# Patient Record
Sex: Female | Born: 1965 | Hispanic: Yes | Marital: Married | Smoking: Never smoker
Health system: Southern US, Community
[De-identification: ages and names within clinical notes are randomized; demographics above are authoritative.]

---

## 2018-01-31 ENCOUNTER — Ambulatory Visit: Payer: Self-pay | Attending: Oncology

## 2018-08-08 ENCOUNTER — Encounter (INDEPENDENT_AMBULATORY_CARE_PROVIDER_SITE_OTHER): Payer: Self-pay

## 2018-08-08 ENCOUNTER — Encounter: Payer: Self-pay | Admitting: *Deleted

## 2018-08-08 ENCOUNTER — Ambulatory Visit: Payer: Self-pay | Attending: Oncology | Admitting: *Deleted

## 2018-08-08 ENCOUNTER — Other Ambulatory Visit: Payer: Self-pay

## 2018-08-08 ENCOUNTER — Ambulatory Visit
Admission: RE | Admit: 2018-08-08 | Discharge: 2018-08-08 | Disposition: A | Discharge status: Home / Self Care | Payer: Self-pay | Source: Ambulatory Visit | Attending: Oncology | Admitting: Oncology

## 2018-08-08 ENCOUNTER — Other Ambulatory Visit: Payer: Self-pay | Admitting: *Deleted

## 2018-08-08 VITALS — BP 138/31 | HR 73 | Temp 98.0°F | Ht 62.0 in | Wt 179.0 lb

## 2018-08-08 DIAGNOSIS — N6489 Other specified disorders of breast: Secondary | ICD-10-CM

## 2018-08-08 DIAGNOSIS — Z0000 Encounter for general adult medical examination without abnormal findings: Secondary | ICD-10-CM | POA: Insufficient documentation
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
## 2018-08-08 NOTE — Patient Instructions (Signed)
Gave patient hand-out, Women Staying Healthy, Active and Well from [facility], with education on breast health, pap smears, heart and colon health. 

## 2018-09-13 ENCOUNTER — Encounter: Payer: Self-pay | Admitting: *Deleted

## 2018-09-13 NOTE — Progress Notes (Signed)
Patient has not been scheduled for her additional views.  Certified letter mailed by the [facility].  I have asked Joellyn Quails to try and contact the patient before I close her case as refusal to follow-up.

## 2018-09-20 ENCOUNTER — Ambulatory Visit
Admission: RE | Admit: 2018-09-20 | Discharge: 2018-09-20 | Disposition: A | Discharge status: Home / Self Care | Payer: Self-pay | Source: Ambulatory Visit | Attending: Oncology | Admitting: Oncology

## 2018-09-20 ENCOUNTER — Other Ambulatory Visit: Payer: Self-pay

## 2018-09-20 DIAGNOSIS — N6489 Other specified disorders of breast: Secondary | ICD-10-CM

## 2018-09-20 DIAGNOSIS — N63 Unspecified lump in unspecified breast: Secondary | ICD-10-CM

## 2018-09-20 NOTE — Progress Notes (Signed)
Radiologist discussed Birads 3 results with patient.  Scheduled 6 month follow-up mammogram and ultrasound on 03/25/2019.  Letter with appointment info mailed to patient.

## 2018-09-24 ENCOUNTER — Encounter: Payer: Self-pay | Admitting: *Deleted

## 2018-09-24 NOTE — Progress Notes (Unsigned)
Letter mailed by Coralee Rud, RN with appointment date for 6 month follow up mammogram on 03/25/19 @ 11:00.  [facility] to [place].

## 2019-03-25 ENCOUNTER — Other Ambulatory Visit: Payer: Self-pay

## 2020-07-08 ENCOUNTER — Ambulatory Visit: Payer: Self-pay

## 2020-07-08 ENCOUNTER — Other Ambulatory Visit: Payer: Self-pay

## 2020-07-13 NOTE — Progress Notes (Signed)
Patient pre-screened for [facility] eligibility due to COVID 19 precautions. Two patient identifiers used for verification that I was speaking to correct patient.  Patient to Present directly to [facility] 07/15/20 at 1:30 for [facility] screening mammogram. Risk Assessment    Risk Scores      07/15/2020 08/08/2018   Last edited by: Scarlett Presto, RN Scarlett Presto, RN   5-year risk: 0.5 % 0.5 %   Lifetime risk: 3.9 % 4 %

## 2020-07-15 ENCOUNTER — Ambulatory Visit
Admission: RE | Admit: 2020-07-15 | Discharge: 2020-07-15 | Disposition: A | Discharge status: Home / Self Care | Payer: Self-pay | Source: Ambulatory Visit | Attending: Oncology | Admitting: Oncology

## 2020-07-15 ENCOUNTER — Ambulatory Visit: Payer: Self-pay | Attending: Oncology

## 2020-07-15 ENCOUNTER — Other Ambulatory Visit: Payer: Self-pay

## 2020-07-15 DIAGNOSIS — N63 Unspecified lump in unspecified breast: Secondary | ICD-10-CM

## 2020-07-16 NOTE — Progress Notes (Signed)
Letter mailed from [facility] to notify of normal mammogram results.  Patient to return in one year for annual screening.  Copy to [facility]. 

## 2020-08-19 ENCOUNTER — Encounter: Payer: Self-pay | Admitting: *Deleted

## 2020-08-19 ENCOUNTER — Ambulatory Visit: Payer: Self-pay | Admitting: Gastroenterology

## 2021-09-28 ENCOUNTER — Ambulatory Visit
Admission: EM | Admit: 2021-09-28 | Discharge: 2021-09-28 | Disposition: A | Discharge status: Home / Self Care | Payer: 59 | Attending: Internal Medicine | Admitting: Internal Medicine

## 2021-09-28 ENCOUNTER — Other Ambulatory Visit: Payer: Self-pay

## 2021-09-28 ENCOUNTER — Ambulatory Visit (INDEPENDENT_AMBULATORY_CARE_PROVIDER_SITE_OTHER): Payer: 59

## 2021-09-28 DIAGNOSIS — K529 Noninfective gastroenteritis and colitis, unspecified: Secondary | ICD-10-CM | POA: Diagnosis not present

## 2021-09-28 DIAGNOSIS — R1010 Upper abdominal pain, unspecified: Secondary | ICD-10-CM | POA: Diagnosis not present

## 2021-09-28 LAB — CBC WITH DIFFERENTIAL/PLATELET
Abs Immature Granulocytes: 0.03 10*3/uL (ref 0.00–0.07)
Basophils Absolute: 0 10*3/uL (ref 0.0–0.1)
Basophils Relative: 0 %
Eosinophils Absolute: 0 10*3/uL (ref 0.0–0.5)
Eosinophils Relative: 0 %
HCT: 42.1 % (ref 36.0–46.0)
Hemoglobin: 13.9 g/dL (ref 12.0–15.0)
Immature Granulocytes: 0 %
Lymphocytes Relative: 4 %
Lymphs Abs: 0.5 10*3/uL — ABNORMAL LOW (ref 0.7–4.0)
MCH: 27.9 pg (ref 26.0–34.0)
MCHC: 33 g/dL (ref 30.0–36.0)
MCV: 84.4 fL (ref 80.0–100.0)
Monocytes Absolute: 0.7 10*3/uL (ref 0.1–1.0)
Monocytes Relative: 5 %
Neutro Abs: 10.9 10*3/uL — ABNORMAL HIGH (ref 1.7–7.7)
Neutrophils Relative %: 91 %
Platelets: 217 10*3/uL (ref 150–400)
RBC: 4.99 MIL/uL (ref 3.87–5.11)
RDW: 14.2 % (ref 11.5–15.5)
WBC: 12.1 10*3/uL — ABNORMAL HIGH (ref 4.0–10.5)
nRBC: 0 % (ref 0.0–0.2)

## 2021-09-28 LAB — COMPREHENSIVE METABOLIC PANEL
ALT: 21 U/L (ref 0–44)
AST: 26 U/L (ref 15–41)
Albumin: 4.6 g/dL (ref 3.5–5.0)
Alkaline Phosphatase: 100 U/L (ref 38–126)
Anion gap: 9 (ref 5–15)
BUN: 10 mg/dL (ref 6–20)
CO2: 26 mmol/L (ref 22–32)
Calcium: 9.1 mg/dL (ref 8.9–10.3)
Chloride: 100 mmol/L (ref 98–111)
Creatinine, Ser: 0.54 mg/dL (ref 0.44–1.00)
GFR, Estimated: >60 mL/min (ref >60)
Glucose, Bld: 117 mg/dL — ABNORMAL HIGH (ref 70–99)
Potassium: 3.8 mmol/L (ref 3.5–5.1)
Sodium: 135 mmol/L (ref 135–145)
Total Bilirubin: 0.9 mg/dL (ref 0.3–1.2)
Total Protein: 8.3 g/dL — ABNORMAL HIGH (ref 6.5–8.1)

## 2021-09-28 LAB — URINALYSIS, ROUTINE W REFLEX MICROSCOPIC
Bilirubin Urine: NEGATIVE
Glucose, UA: NEGATIVE mg/dL
Hgb urine dipstick: NEGATIVE
Ketones, ur: 15 mg/dL — AB
Leukocytes,Ua: NEGATIVE
Nitrite: NEGATIVE
Protein, ur: 30 mg/dL — AB
Specific Gravity, Urine: 1.02 (ref 1.005–1.030)
pH: 7.5 (ref 5.0–8.0)

## 2021-09-28 LAB — AMYLASE: Amylase: 55 U/L (ref 28–100)

## 2021-09-28 LAB — URINALYSIS, MICROSCOPIC (REFLEX)

## 2021-09-28 LAB — LIPASE, BLOOD: Lipase: 27 U/L (ref 11–51)

## 2021-09-28 MED ORDER — ONDANSETRON 8 MG PO TBDP
8.0000 mg | ORAL_TABLET | Freq: Once | ORAL | Status: AC
  Administered 2021-09-28: 8 mg via ORAL

## 2021-09-28 MED ORDER — ONDANSETRON HCL 4 MG PO TABS: 8.0000 mg | ORAL_TABLET | ORAL | 0 refills | Status: DC | PRN

## 2021-09-28 MED ORDER — KETOROLAC TROMETHAMINE 60 MG/2ML IM SOLN
60.0000 mg | Freq: Once | INTRAMUSCULAR | Status: AC
  Administered 2021-09-28: 60 mg via INTRAMUSCULAR

## 2021-09-28 MED ORDER — ONDANSETRON HCL 4 MG/2ML IJ SOLN: 8.0000 mg | Freq: Once | INTRAMUSCULAR | Status: DC

## 2021-09-28 MED ORDER — FAMOTIDINE 20 MG PO TABS: 20.0000 mg | ORAL_TABLET | Freq: Two times a day (BID) | ORAL | 0 refills | Status: AC

## 2021-09-28 NOTE — Discharge Instructions (Signed)
Symptoms and exam today are consistent with a stomach bug, typically caused by a virus.  Labs including blood counts, chemistries, liver and kidney tests, and pancreatic enzymes are all consistent with a viral infection.  No danger signs on labs, xray, or exam.  Anticipate symptoms will run their course over the next several days.  Prescriptions for ondansetron (for nausea) and famotidine (for nausea and upper abdominal pain) were sent to the pharmacy.  Push fluids and rest.  Diet as tolerated.  Recheck or go to ED for severe/sustained abdominal pain, persistent vomiting, persistent (>3 days) fever >100.5, or if not improving as expected.   ?

## 2021-09-28 NOTE — ED Provider Notes (Signed)
?[facility] ? ? ? ?CSN: [id number] ?Arrival date & time: 09/28/21  1234 ? ? ?  ? ?History   ?Chief Complaint ?Chief Complaint  ?Patient presents with  ? Generalized Body Aches  ? Abdominal Pain  ? ? ?HPI ?Alexa Moran is a 56 y.o. female.  She presents today with upper abdominal pain, rates this 10 out of 10, and says it started about 3 hours ago.  She can feel it through to her back.   ? ?No vomiting, although has been nauseous and has had small-volume retching of "phlegm".   ? ?Has had cough for couple of weeks, not getting worse, maybe a little bit of sore throat.  Has a bad headache.   ? ?No diarrhea, had a BM just prior to coming to the urgent care today.  Sometimes feels constipated, has to strain, has recently felt like she had to go many times, but can only go a little bit.   ? ?Fever to 100.9.  Denies any urinary symptoms, no vaginal discharge or bleeding.   ? ?Had her tubes tied after her youngest child, unclear if she also had a C-section or not, no other abdominal surgeries. ? ?The only chronic medical condition she endorses is pain in the left knee for which she sometimes takes an arthritis pill, most recently about 4 days ago. ? ? ?Abdominal Pain ? ?History reviewed. No pertinent past medical history. ? ?There are no problems to display for this patient. ? ? ?History reviewed. No pertinent surgical history. ? ? ?Home Medications   ? ?Pill for arthritis pain in left knee, unknown name ? ? ?Family History ?Family History  ?Problem Relation Age of Onset  ? Breast cancer Neg Hx   ? ? ?Social History ?Social History  ? ?Tobacco Use  ? Smoking status: Never  ? Smokeless tobacco: Never  ?Vaping Use  ? Vaping Use: Never used  ?Substance Use Topics  ? Alcohol use: Never  ? Drug use: Never  ? ? ? ?Allergies   ?Patient has no known allergies. ?No medication allergies per patient ? ?Review of Systems ?Review of Systems  ?Gastrointestinal:  Positive for abdominal pain.  See HPI ? ? ?Physical  Exam ?Triage Vital Signs ?ED Triage Vitals  ?Enc Vitals Group  ?   BP 09/28/21 1436 128/74  ?   Pulse Rate 09/28/21 1436 90  ?   Resp 09/28/21 1436 18  ?   Temp 09/28/21 1436 (!) 100.9 ?F (38.3 ?C)  ?   Temp Source 09/28/21 1436 Oral  ?   SpO2 09/28/21 1436 96 %  ?   Weight 09/28/21 1438 178 lb (80.7 kg)  ?   Height 09/28/21 1438 5\' 3"  (1.6 m)  ?   Pain Score 09/28/21 1436 10  ?   Pain Loc --   ? ? ?Updated Vital Signs ?BP 128/74 (BP Location: Left Arm)   Pulse 90   Temp (!) 100.9 ?F (38.3 ?C) (Oral)   Resp 18   Ht 5\' 3"  (1.6 m)   Wt 80.7 kg   SpO2 96%   BMI 31.53 kg/m?  ? ?Physical Exam ?Constitutional:   ?   General: She is in acute distress.  ?   Appearance: She is ill-appearing. She is not toxic-appearing.  ?   Comments: Holding abdomen, looks uncomfortable ?Good hygiene ?Translator service used  ?HENT:  ?   Head: Atraumatic.  ?   Mouth/Throat:  ?   Mouth: Mucous membranes are moist.  ?  Eyes:  ?   Conjunctiva/sclera:  ?   Right eye: Right conjunctiva is not injected. No exudate. ?   Left eye: Left conjunctiva is not injected. No exudate. ?   Comments: Conjugate gaze observed  ?Cardiovascular:  ?   Rate and Rhythm: Normal rate and regular rhythm.  ?Pulmonary:  ?   Effort: Pulmonary effort is normal. No respiratory distress.  ?   Breath sounds: No wheezing or rhonchi.  ?Abdominal:  ?   Palpations: Abdomen is soft.  ?   Tenderness: There is no right CVA tenderness or left CVA tenderness.  ?   Comments: Moderate tenderness to deep palpation throughout, appears most intense in the epigastrium.  Equivocal voluntary type guarding, no frank rebound  ?Musculoskeletal:  ?   Comments: Walked into the urgent care independently ?Most comfortable reclining on the exam table  ?Skin: ?   General: Skin is warm and dry.  ?   Comments: No cyanosis  ?Neurological:  ?   Mental Status: She is alert.  ?   Comments: Face is symmetric  ? ? ? ?UC Treatments / Results  ?Labs ?(all labs ordered are listed, but only abnormal results  are displayed) ?Labs Reviewed  ?COMPREHENSIVE METABOLIC PANEL - Abnormal; Notable for the following components:  ?    Result Value  ? Glucose, Bld 117 (*)   ? Total Protein 8.3 (*)   ? All other components within normal limits  ?CBC WITH DIFFERENTIAL/PLATELET - Abnormal; Notable for the following components:  ? WBC 12.1 (*)   ? Neutro Abs 10.9 (*)   ? Lymphs Abs 0.5 (*)   ? All other components within normal limits  ?URINALYSIS, ROUTINE W REFLEX MICROSCOPIC - Abnormal; Notable for the following components:  ? APPearance HAZY (*)   ? Ketones, ur 15 (*)   ? Protein, ur 30 (*)   ? All other components within normal limits  ?URINALYSIS, MICROSCOPIC (REFLEX) - Abnormal; Notable for the following components:  ? Bacteria, UA FEW (*)   ? All other components within normal limits  ?AMYLASE  ?LIPASE, BLOOD  ?Hgb 13.9, plts 217, creat 0.54, ast 26, alt 21, lytes unremarkable, CO2 26. ?Amylase & lipase wnl ?UA 0-5 RBC, 0-5 WBC, few bacteria, 6-10 squames.   ? ?EKG ?NA ? ?Radiology ?DG Abd Acute W/Chest ? ?Result Date: 09/28/2021 ?CLINICAL DATA:  Acute upper abdominal pain. EXAM: DG ABDOMEN ACUTE WITH 1 VIEW CHEST COMPARISON:  None. FINDINGS: There is no evidence of dilated bowel loops or free intraperitoneal air. No radiopaque calculi or other significant radiographic abnormality is seen. Heart size and mediastinal contours are within normal limits. Both lungs are clear. IMPRESSION: Negative abdominal radiographs.  No acute cardiopulmonary disease. Electronically Signed   By: Lupita RaiderJames  Green Jr M.D.   On: 09/28/2021 16:17   ? ?Procedures ?Procedures (including critical care time) ?NA ? ?Medications Ordered in UC ?Medications  ?ketorolac (TORADOL) injection 60 mg (60 mg Intramuscular Given 09/28/21 1550)  ?ondansetron (ZOFRAN-ODT) disintegrating tablet 8 mg (8 mg Oral Given 09/28/21 1549)  ? ? ?Initial Impression / Assessment and Plan / UC Course  ? ?Significant mprovement in overall well being 1 hour after toradol/zofran. ?Labs  reassuring, abd xrays unremarkable.   ? ?Final Clinical Impressions(s) / UC Diagnoses  ? ?Final diagnoses:  ?Acute gastroenteritis  ? ? ? ?Discharge Instructions   ? ?  ?Symptoms and exam today are consistent with a stomach bug, typically caused by a virus.  Labs including blood counts, chemistries, liver and  kidney tests, and pancreatic enzymes are all consistent with a viral infection.  No danger signs on labs, xray, or exam.  Anticipate symptoms will run their course over the next several days.  Prescriptions for ondansetron (for nausea) and famotidine (for nausea and upper abdominal pain) were sent to the pharmacy.  Push fluids and rest.  Diet as tolerated.  Recheck or go to ED for severe/sustained abdominal pain, persistent vomiting, persistent (>3 days) fever >100.5, or if not improving as expected.   ? ? ? ? ?ED Prescriptions   ? ? Medication Sig Dispense Auth. Provider  ? ondansetron (ZOFRAN) 4 MG tablet Take 2 tablets (8 mg total) by mouth every 4 (four) hours as needed for nausea or vomiting. 20 tablet Isa Rankin, MD  ? famotidine (PEPCID) 20 MG tablet Take 1 tablet (20 mg total) by mouth 2 (two) times daily. 30 tablet Isa Rankin, MD  ? ?  ? ?PDMP not reviewed this encounter. ?  ?Isa Rankin, MD ?09/29/21 1208 ? ?

## 2021-09-28 NOTE — ED Triage Notes (Signed)
Used interpreter Doran Heater [phone] ? ?Pt c/o pain at the top of the stomach and along her back x1day.  ? ?Pt states that her last bowel movement was before coming to the urgent care.  ?

## 2022-06-19 IMAGING — MG DIGITAL DIAGNOSTIC BILAT W/ TOMO W/ CAD
6 of 12 series · 6 of 36 positions shown · non-contrast
Comparison: Previous exam(s).

CLINICAL DATA: Delayed follow-up for probably benign cyst in the
LEFT breast at the 6 o'clock axis. Patient also describes a palpable
lump within the upper-outer quadrant of the LEFT breast.

EXAM:
DIGITAL DIAGNOSTIC BILATERAL MAMMOGRAM WITH CAD AND TOMOSYNTHESIS
ULTRASOUND BREAST LEFT
TECHNIQUE: Bilateral digital diagnostic mammography and breast tomosynthesis
was performed. Digital images of the breasts were evaluated with
computer-aided detection. Targeted ultrasound examination of the
breast was performed.

[L CC synth-2D]
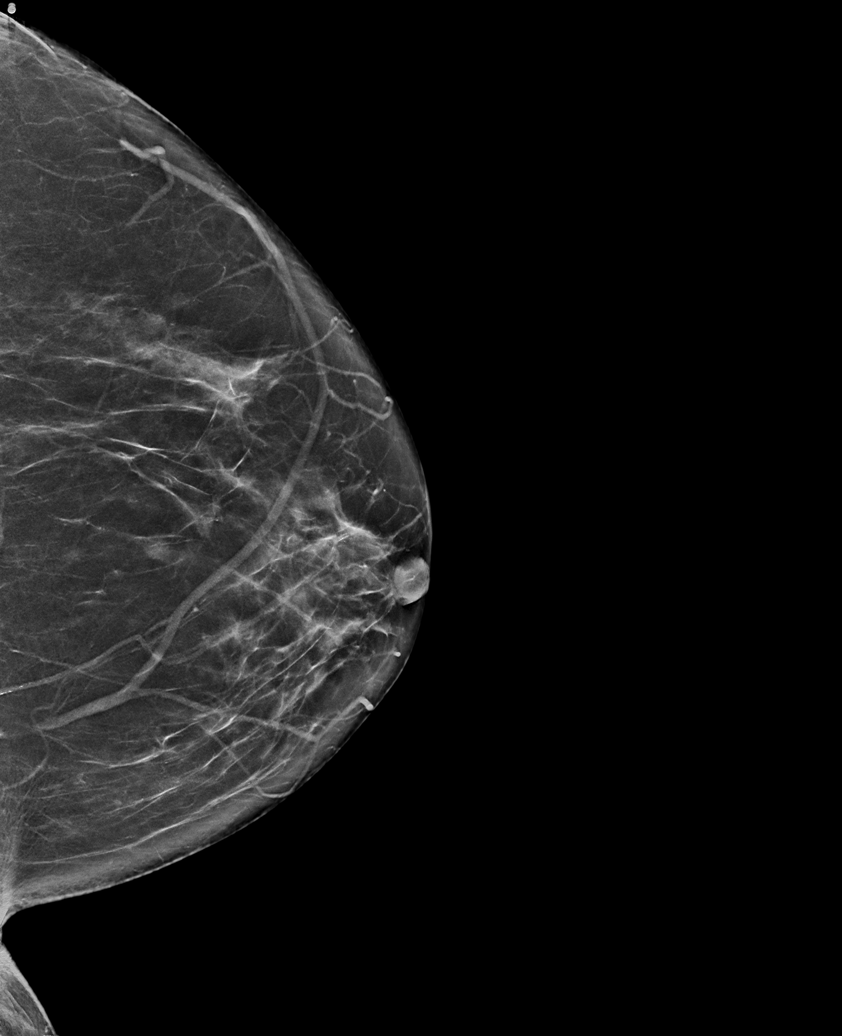

[R CC synth-2D]
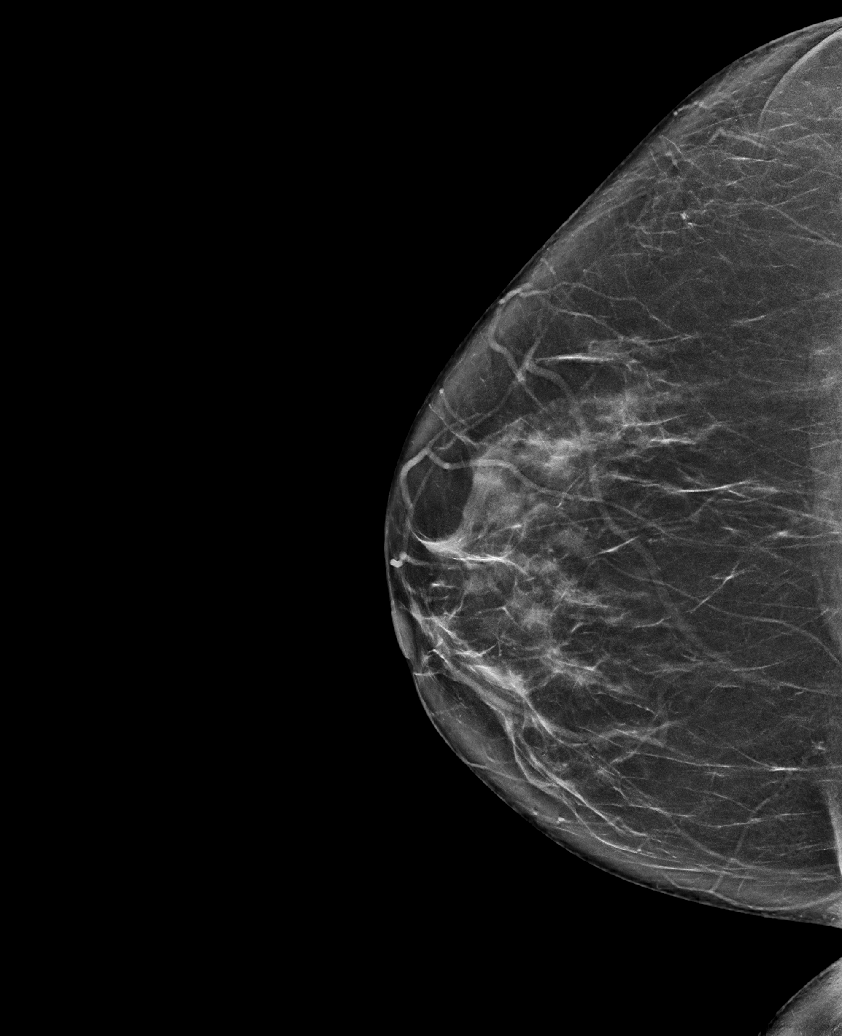

[R MLO synth-2D]
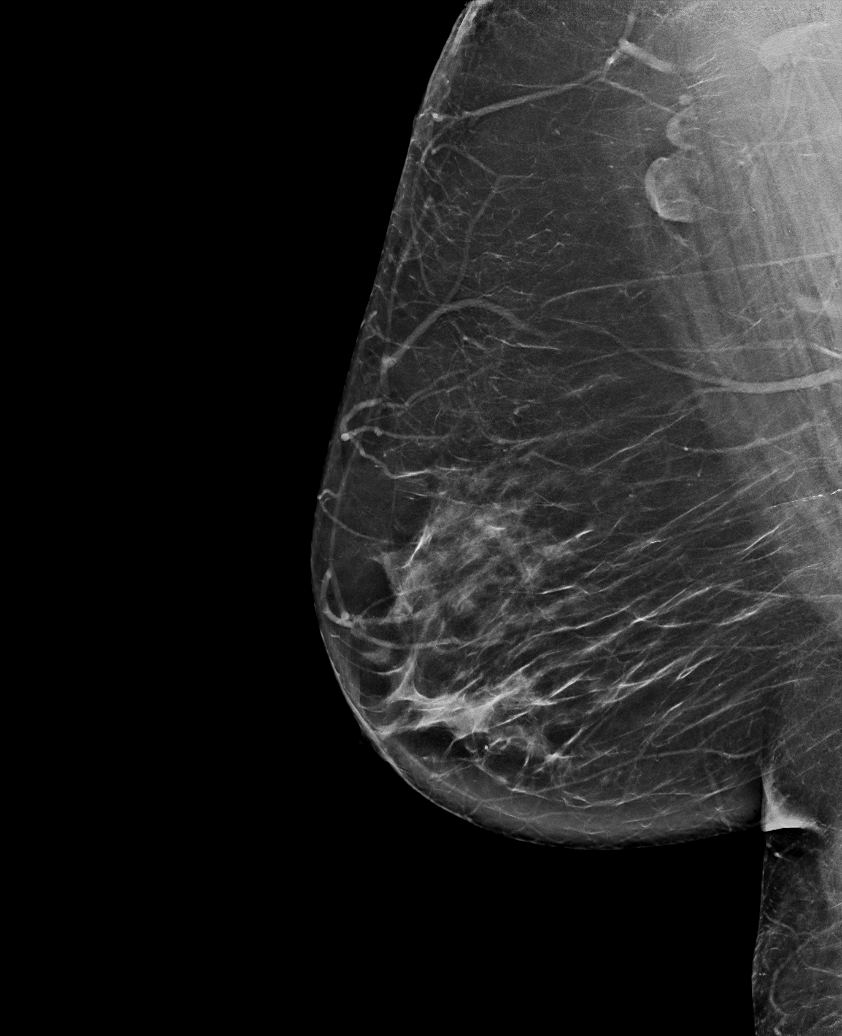

[L TAN synth-2D]
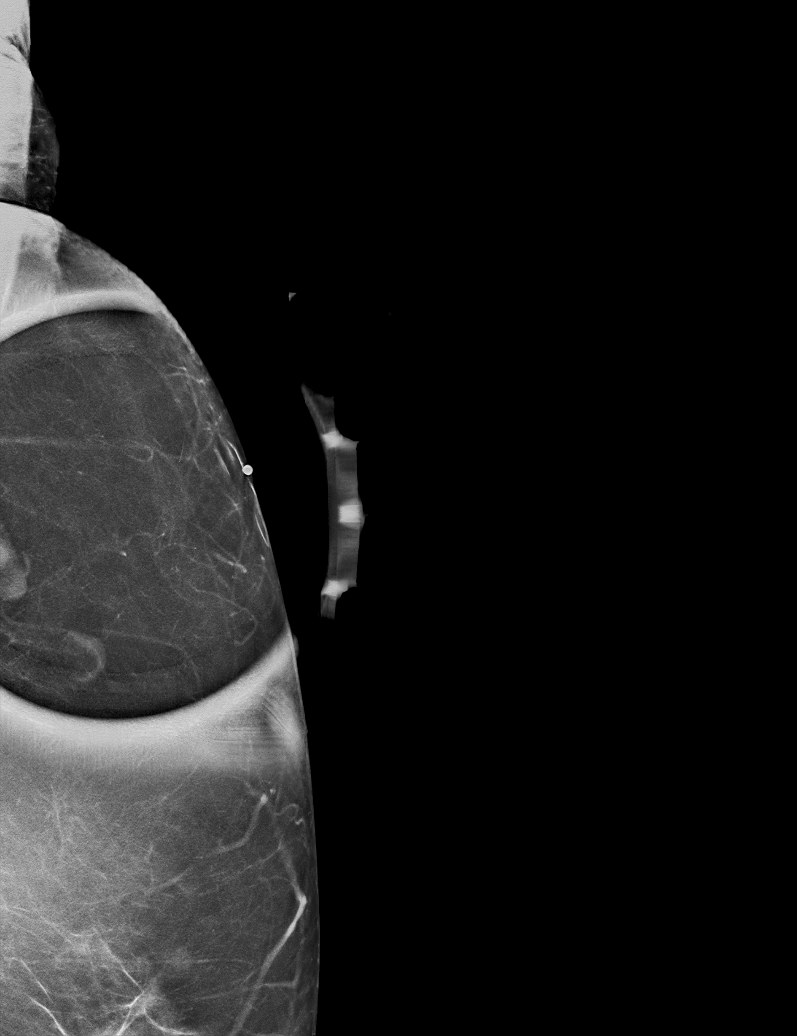

[L MLO synth-2D]
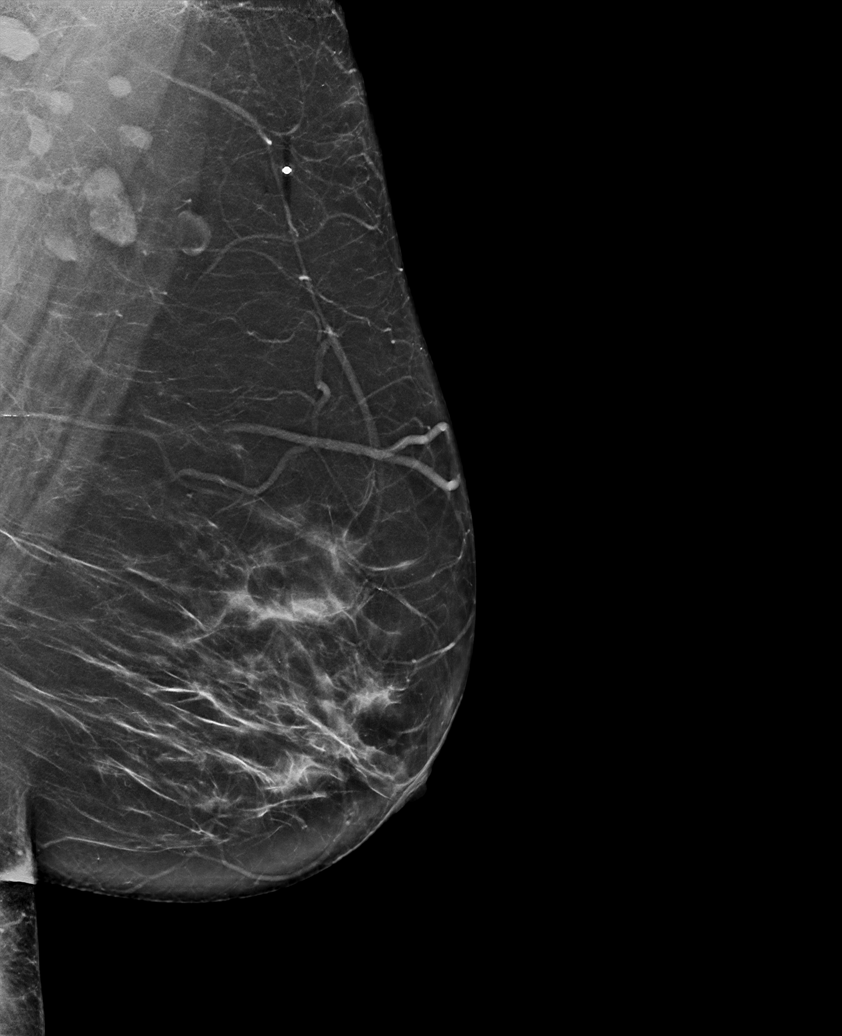

[L XCCL synth-2D]
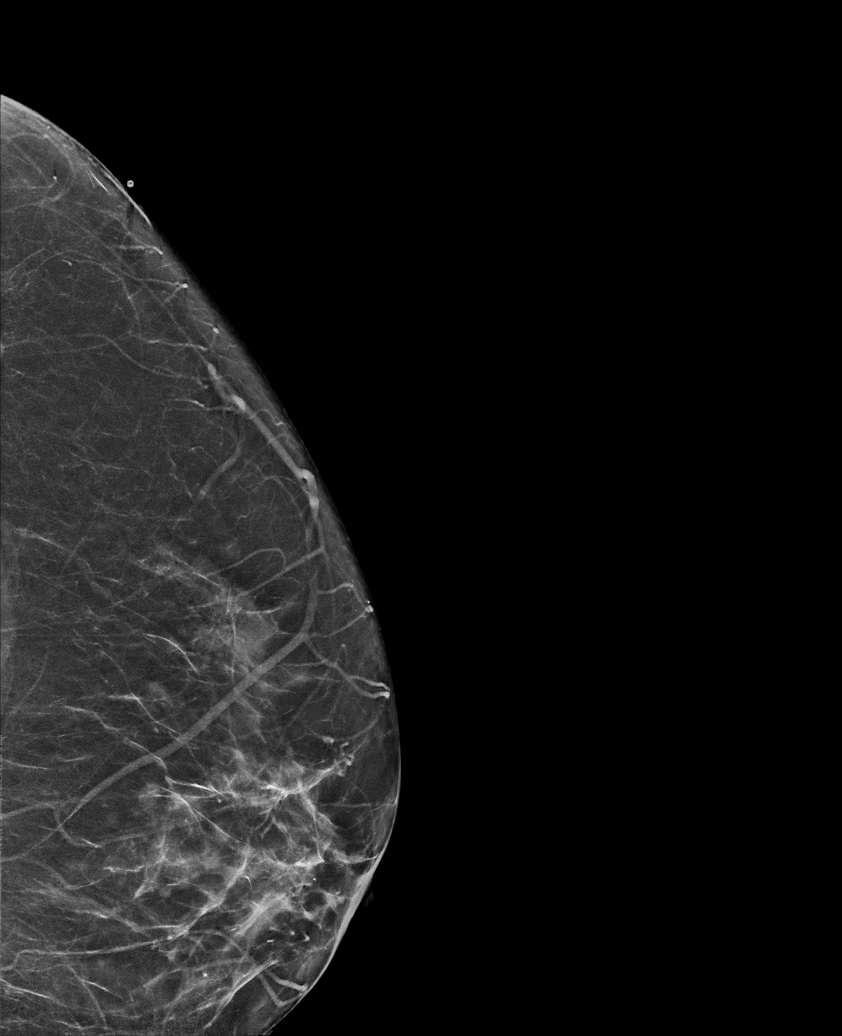

[6 of 36 positions shown; findings below may reference images not displayed]

ACR Breast Density Category c: The breast tissue is heterogeneously
dense, which may obscure small masses.
FINDINGS: There are no new dominant masses, suspicious calcifications or
secondary signs of malignancy within either breast. Previously
identified low-density mass within the lower central LEFT breast is
stable in size suggesting benignity.

Targeted ultrasound is performed, again showing an oval nearly
anechoic mass in the LEFT breast at the 6 o'clock axis, 2 cm from
the nipple, measuring 6 mm, not significantly changed for nearly 2
years confirming benignity, compatible with a benign cyst.

Additional targeted ultrasound is performed of the outer LEFT breast
with particular attention to the 3 o'clock axis as directed by the
patient, showing an additional benign cyst at the 3 o'clock axis, 8
cm from the nipple, measuring 6 mm, corresponding to the palpable
area of concern. No suspicious solid or cystic mass is identified by
ultrasound in this area.
IMPRESSION: 1. No evidence of malignancy within either breast.
2. Two benign cysts within the LEFT breast, 1 of which corresponds
to the palpable area of concern. No follow-up is needed for these
benign findings.

Patient may return to routine annual bilateral screening mammogram
schedule.

RECOMMENDATION:
Screening mammogram in one year.(Code:[id number]-C-FZ9)

I have discussed the findings and recommendations with the patient.
If applicable, a reminder letter will be sent to the patient
regarding the next appointment.

BI-RADS CATEGORY  2: Benign.

## 2024-02-08 ENCOUNTER — Ambulatory Visit (INDEPENDENT_AMBULATORY_CARE_PROVIDER_SITE_OTHER)

## 2024-02-08 ENCOUNTER — Ambulatory Visit
Admission: EM | Admit: 2024-02-08 | Discharge: 2024-02-08 | Disposition: A | Discharge status: Home / Self Care | Attending: Family Medicine | Admitting: Family Medicine

## 2024-02-08 DIAGNOSIS — R300 Dysuria: Secondary | ICD-10-CM | POA: Insufficient documentation

## 2024-02-08 DIAGNOSIS — R509 Fever, unspecified: Secondary | ICD-10-CM | POA: Insufficient documentation

## 2024-02-08 DIAGNOSIS — J989 Respiratory disorder, unspecified: Secondary | ICD-10-CM | POA: Insufficient documentation

## 2024-02-08 DIAGNOSIS — J101 Influenza due to other identified influenza virus with other respiratory manifestations: Secondary | ICD-10-CM | POA: Diagnosis present

## 2024-02-08 LAB — URINALYSIS, W/ REFLEX TO CULTURE (INFECTION SUSPECTED)
Glucose, UA: NEGATIVE mg/dL
Ketones, ur: 80 mg/dL — AB
Leukocytes,Ua: NEGATIVE
Nitrite: NEGATIVE
Protein, ur: 30 mg/dL — AB
RBC / HPF: NONE SEEN RBC/hpf (ref 0–5)
Specific Gravity, Urine: 1.02 (ref 1.005–1.030)
pH: 6.5 (ref 5.0–8.0)

## 2024-02-08 LAB — RESP PANEL BY RT-PCR (FLU A&B, COVID) ARPGX2
Influenza A by PCR: NEGATIVE
Influenza B by PCR: POSITIVE — AB
SARS Coronavirus 2 by RT PCR: NEGATIVE

## 2024-02-08 MED ORDER — ONDANSETRON HCL 4 MG PO TABS: 4.0000 mg | ORAL_TABLET | Freq: Four times a day (QID) | ORAL | 0 refills | Status: AC | PRN

## 2024-02-08 MED ORDER — IBUPROFEN 800 MG PO TABS
800.0000 mg | ORAL_TABLET | Freq: Once | ORAL | Status: AC
  Administered 2024-02-08: 800 mg via ORAL

## 2024-02-08 NOTE — ED Triage Notes (Signed)
 Used interpreter Aloysius [phone]  Pt is with her daughter  Pt c/o pain in the left flank, abdominal pain, headache, body aches, and trouble breathing x3days

## 2024-02-08 NOTE — ED Provider Notes (Signed)
 [facility]    CSN: [id number] Arrival date & time: 02/08/24  1005      History   Chief Complaint Chief Complaint  Patient presents with   Cough   Diarrhea   Shortness of Breath    HPI Alexa Moran is a 58 y.o. female.   HPI  A Spanish interpreter was used for this encounter:  Name: Aloysius ID #[id number]  History obtained from the patient and daughter . Nick presents for headache, lower abdominal pain, chest pain, cough, rhinorrhea, nasal congestion, vomiting, diarrhea, dysuria, fever that started 3 days ago.  No known sick contacts.  Last night, she took fever reducing medication. She didn't take her temperature but was really hot. Chest pain and shortness of breath occur with coughing.    No LMP recorded. Patient is postmenopausal.     History reviewed. No pertinent past medical history.  There are no active problems to display for this patient.   History reviewed. No pertinent surgical history.  OB History   No obstetric history on file.      Home Medications    Prior to Admission medications   Medication Sig Start Date End Date Taking? Authorizing Provider  famotidine  (PEPCID ) 20 MG tablet Take 1 tablet (20 mg total) by mouth 2 (two) times daily. 09/28/21  Yes Jason Leita Blush, MD  ondansetron  (ZOFRAN ) 4 MG tablet Take 2 tablets (8 mg total) by mouth every 4 (four) hours as needed for nausea or vomiting. 09/28/21  Yes Jason Leita Blush, MD    Family History Family History  Problem Relation Age of Onset   Breast cancer Neg Hx     Social History Social History   Tobacco Use   Smoking status: Never   Smokeless tobacco: Never  Vaping Use   Vaping status: Never Used  Substance Use Topics   Alcohol use: Never   Drug use: Never     Allergies   Patient has no known allergies.   Review of Systems Review of Systems: negative unless otherwise stated in HPI.      Physical Exam Triage Vital Signs ED Triage Vitals   Encounter Vitals Group     BP 02/08/24 1035 131/84     Girls Systolic BP Percentile --      Girls Diastolic BP Percentile --      Boys Systolic BP Percentile --      Boys Diastolic BP Percentile --      Pulse Rate 02/08/24 1035 92     Resp --      Temp 02/08/24 1035 (!) 100.9 F (38.3 C)     Temp Source 02/08/24 1035 Oral     SpO2 02/08/24 1035 95 %     Weight 02/08/24 1033 188 lb (85.3 kg)     Height --      Head Circumference --      Peak Flow --      Pain Score 02/08/24 1033 9     Pain Loc --      Pain Education --      Exclude from Growth Chart --    No data found.  Updated Vital Signs BP 131/84 (BP Location: Left Arm)   Pulse 92   Temp (!) 100.9 F (38.3 C) (Oral)   Wt 85.3 kg   SpO2 95%   BMI 33.30 kg/m   Visual Acuity Right Eye Distance:   Left Eye Distance:   Bilateral Distance:    Right Eye Near:  Left Eye Near:    Bilateral Near:     Physical Exam GEN:     alert, non-toxic appearing female in no distress ***   HENT:  mucus membranes moist, oropharyngeal ***without lesions or ***erythema, no*** tonsillar hypertrophy or exudates, *** moderate erythematous edematous turbinates, ***clear nasal discharge, ***bilateral TM normal EYES:   pupils equal and reactive, ***no scleral injection or discharge NECK:  normal ROM, no ***lymphadenopathy, ***no meningismus   RESP:  no increased work of breathing, ***clear to auscultation bilaterally CVS:   regular rate ***and rhythm Skin:   warm and dry, no rash on visible skin***    UC Treatments / Results  Labs (all labs ordered are listed, but only abnormal results are displayed) Labs Reviewed  RESP PANEL BY RT-PCR (FLU A&B, COVID) [id number]    EKG   Radiology No results found.  Procedures Procedures (including critical care time)  Medications Ordered in UC Medications  ibuprofen  (ADVIL ) tablet 800 mg (800 mg Oral Given 02/08/24 1045)    Initial Impression / Assessment and Plan / UC Course  I have  reviewed the triage vital signs and the nursing notes.  Pertinent labs & imaging results that were available during my care of the patient were reviewed by me and considered in my medical decision making (see chart for details).       Pt is a 58 y.o. female who presents for *** days of respiratory symptoms. [place] is ***afebrile here without recent antipyretics. Satting well on room air. Overall pt is ***non-toxic appearing, well hydrated, without respiratory distress. Pulmonary exam ***is unremarkable.  COVID and influenza panel obtained ***and was negative. ***Pt to quarantine until COVID test results or longer if positive.  I will call patient with test results, if positive. History consistent with ***viral respiratory illness. Discussed symptomatic treatment.  Explained lack of efficacy of antibiotics in viral disease.  Typical duration of symptoms discussed.   Return and ED precautions given and voiced understanding. Discussed MDM, treatment plan and plan for follow-up with patient*** who agrees with plan.     Final Clinical Impressions(s) / UC Diagnoses   Final diagnoses:  None   Discharge Instructions   None    ED Prescriptions   None    PDMP not reviewed this encounter.

## 2024-02-08 NOTE — Discharge Instructions (Addendum)
 Su radiografa de trax no mostr evidencia de neumona. No tiene una infeccin del tracto urinario. Su prueba de COVID-19 es negativa, pero tiene influenza B. Sus sntomas mejorarn [employer] prximos 7 a [address]. La tos puede durar unas 3 semanas.  Tome ibuprofeno 600 mg con Tylenol 1000 mg para la fiebre, el dolor de cabeza o Orthoptist.  Para la tos: Tambin puede usar guaifenesina y dextrometorfano para la tos. Puede usar un humidificador para la congestin del pecho y la tos. Si no tiene un humidificador, puede sentarse en el bao con la ducha de agua caliente abierta.  Para el dolor de garganta: pruebe grgaras con agua tibia con sal, pastillas para la tos Mucinex para el dolor de garganta o pastillas Cepacol, espray para la garganta, t o agua tibia con limn o miel, paletas heladas o hielo, o medicamentos de venta libre para el resfriado para las [address] de [address]. Tambin puede comprar espray Chloraseptic en la farmacia o tienda de todo a Programmer, multimedia.  Para la congestin: tome un antihistamnico diario como Zyrtec o Claritin y un descongestionante oral, como la pseudoefedrina. Tambin puede usar Flonase, 1 o 2 aplicaciones en cada fosa nasal al da. Afrin tambin es una buena opcin si no tiene presin arterial alta.  Es importante mantenerse hidratado: beba muchos lquidos (agua, Gatorade/Powerade/Pedialyte, jugos o ts) para Photographer garganta hidratada y Paramedic an ms la irritacin o las molestias.  Regrese o acuda a urgencias si los sntomas empeoran o no mejoran en los [address].  Your chest xray did not show evidence of pneumonia. You do not have a urinary tract infection.  Your COVID test is negative however have influenza B. Your symptoms will gradually improve over the next 7 to 10 days.  The cough may last about 3 weeks.   Take ibuprofen  600 mg with Tylenol 1000 mg for fever, headache or body aches.   For cough: You can also use guaifenesin and  dextromethorphan for cough. You can use a humidifier for chest congestion and cough.  If you don't have a humidifier, you can sit in the bathroom with the hot shower running.      For sore throat: try warm salt water gargles, Mucinex sore throat cough drops or cepacol lozenges, throat spray, warm tea or water with lemon/honey, popsicles or ice, or OTC cold relief medicine for throat discomfort. You can also purchase chloraseptic spray at the pharmacy or dollar store.   For congestion: take a daily anti-histamine like Zyrtec, Claritin, and a oral decongestant, such as pseudoephedrine.  You can also use Flonase 1-2 sprays in each nostril daily. Afrin is also a good option, if you do not have high blood pressure.    It is important to stay hydrated: drink plenty of fluids (water, gatorade/powerade/pedialyte, juices, or teas) to keep your throat moisturized and help further relieve irritation/discomfort.    Return or go to the Emergency Department if symptoms worsen or do not improve in the next few days

## 2024-06-01 ENCOUNTER — Emergency Department
Admission: EM | Admit: 2024-06-01 | Discharge: 2024-06-01 | Disposition: A | Discharge status: Home / Self Care | Attending: Emergency Medicine | Admitting: Emergency Medicine

## 2024-06-01 ENCOUNTER — Other Ambulatory Visit: Payer: Self-pay

## 2024-06-01 DIAGNOSIS — W460XXA Contact with hypodermic needle, initial encounter: Secondary | ICD-10-CM | POA: Insufficient documentation

## 2024-06-01 DIAGNOSIS — Z7721 Contact with and (suspected) exposure to potentially hazardous body fluids: Secondary | ICD-10-CM | POA: Insufficient documentation

## 2024-06-01 NOTE — ED Notes (Signed)
 Attempted blood draw x2 without success.   Lab called for blood draw

## 2024-06-01 NOTE — ED Triage Notes (Addendum)
 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 Pt states she was stuck by the needle in lower right abdomen, pt showing picture of insulin glargine syringe.

## 2024-06-01 NOTE — ED Provider Notes (Signed)
 [facility] Provider Note    Event Date/Time   First MD Initiated Contact with Patient 06/01/24 1351     (approximate)   History   Body Fluid Exposure   HPI  Alexa Moran is a 58 y.o. female with no significant past medical history presents emergency department after a needlestick at work.  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  States then she noticed that there was a insulin injector with no cap.  Has a picture of the actual needle.  States told her employer.  They instructed her to come the ER for testing for postexposure.  States they would try to call the person that was in the room last night to see if they could return and be a source.      Physical Exam   Triage Vital Signs: ED Triage Vitals  Encounter Vitals Group     BP 06/01/24 1313 (!) 157/96     Girls Systolic BP Percentile --      Girls Diastolic BP Percentile --      Boys Systolic BP Percentile --      Boys Diastolic BP Percentile --      Pulse Rate 06/01/24 1313 74     Resp 06/01/24 1313 17     Temp 06/01/24 1313 98.8 F (37.1 C)     Temp Source 06/01/24 1313 Oral     SpO2 06/01/24 1313 100 %     Weight 06/01/24 1316 188 lb 0.8 oz (85.3 kg)     Height 06/01/24 1316 5' 3 (1.6 m)     Head Circumference --      Peak Flow --      Pain Score 06/01/24 1309 2     Pain Loc --      Pain Education --      Exclude from Growth Chart --     Most recent vital signs: Vitals:   06/01/24 1313  BP: (!) 157/96  Pulse: 74  Resp: 17  Temp: 98.8 F (37.1 C)  SpO2: 100%     General: Awake, no distress.   CV:  Good peripheral perfusion.  Resp:  Normal effort.  Abd:  No distention.   Other:  Small tiny bruise noted on the patient's right side of her lower abdomen,    ED Results / Procedures / Treatments   Labs (all labs ordered are listed, but only abnormal results are displayed) Labs Reviewed - No data to  display   EKG     RADIOLOGY     PROCEDURES:   Procedures  Critical Care:  no Chief Complaint  Patient presents with   Body Fluid Exposure      MEDICATIONS ORDERED IN ED: Medications - No data to display   IMPRESSION / MDM / ASSESSMENT AND PLAN / ED COURSE  I reviewed the triage vital signs and the nursing notes.                              Differential diagnosis includes, but is not limited to, needlestick, postexposure evaluation, contusion  Patient's presentation is most consistent with acute illness / injury with system symptoms.   Due to the postexposure and unknown source, I did offer postexposure antiviral therapy which the patient is deferring at this time.  Will do postexposure needlestick workup.    Labs  were sent to [facility].  Results will be called to the patient.  She will need to have additional testing in 3 months in 6 months after the needle exposure.     FINAL CLINICAL IMPRESSION(S) / ED DIAGNOSES   Final diagnoses:  Accidental hypodermic needlestick injury with exposure to body fluid     Rx / DC Orders   ED Discharge Orders     None        Note:  This document was prepared using Dragon voice recognition software and may include unintentional dictation errors.    Gasper Devere ORN, PA-C 06/01/24 1746    Dorothyann Drivers, MD 06/01/24 [phone]

## 2024-06-01 NOTE — ED Notes (Signed)
 3 tiger tops and a lavender top walked down to lab with [facility] form at this time. Pt consent form and WC paperwork folder given to charge RN.

## 2024-06-01 NOTE — Discharge Instructions (Addendum)
 Your results for HIV, Hep C, and Hep B are pending as they are sent to [facility] for workers comp Someone will call you early next week with the results. You will also need additional testing in the next few months and the person that calls you with the results will explain when you will need additional testing and where to go for this
